# Patient Record
Sex: Female | Born: 1995 | Race: White | Hispanic: No | Marital: Single | Smoking: Never smoker
Health system: Southern US, Community
[De-identification: ages and names within clinical notes are randomized; demographics above are authoritative.]

## PROBLEM LIST (undated history)

## (undated) DIAGNOSIS — E282 Polycystic ovarian syndrome: Secondary | ICD-10-CM

## (undated) HISTORY — PX: TONSILLECTOMY: SUR1361

---

## 2017-08-21 ENCOUNTER — Emergency Department
Admission: EM | Admit: 2017-08-21 | Discharge: 2017-08-21 | Disposition: A | Discharge status: Home / Self Care | Payer: Federal, State, Local not specified - PPO | Attending: Emergency Medicine | Admitting: Emergency Medicine

## 2017-08-21 ENCOUNTER — Emergency Department: Payer: Federal, State, Local not specified - PPO

## 2017-08-21 ENCOUNTER — Encounter: Payer: Self-pay | Admitting: Emergency Medicine

## 2017-08-21 DIAGNOSIS — N200 Calculus of kidney: Secondary | ICD-10-CM

## 2017-08-21 DIAGNOSIS — N10 Acute tubulo-interstitial nephritis: Secondary | ICD-10-CM

## 2017-08-21 DIAGNOSIS — R1030 Lower abdominal pain, unspecified: Secondary | ICD-10-CM | POA: Diagnosis present

## 2017-08-21 HISTORY — DX: Polycystic ovarian syndrome: E28.2

## 2017-08-21 LAB — COMPREHENSIVE METABOLIC PANEL
ALBUMIN: 3.9 g/dL (ref 3.5–5.0)
ALK PHOS: 43 U/L (ref 38–126)
ALT: 36 U/L (ref 14–54)
AST: 34 U/L (ref 15–41)
Anion gap: 9 (ref 5–15)
BILIRUBIN TOTAL: 0.2 mg/dL — AB (ref 0.3–1.2)
BUN: 7 mg/dL (ref 6–20)
CALCIUM: 9.1 mg/dL (ref 8.9–10.3)
CO2: 25 mmol/L (ref 22–32)
CREATININE: 0.54 mg/dL (ref 0.44–1.00)
Chloride: 104 mmol/L (ref 101–111)
GFR calc Af Amer: >60 mL/min (ref >60)
GLUCOSE: 126 mg/dL — AB (ref 65–99)
Potassium: 3.6 mmol/L (ref 3.5–5.1)
Sodium: 138 mmol/L (ref 135–145)
TOTAL PROTEIN: 7.4 g/dL (ref 6.5–8.1)

## 2017-08-21 LAB — URINALYSIS, COMPLETE (UACMP) WITH MICROSCOPIC
Bilirubin Urine: NEGATIVE
GLUCOSE, UA: NEGATIVE mg/dL
Ketones, ur: NEGATIVE mg/dL
LEUKOCYTES UA: NEGATIVE
Nitrite: NEGATIVE
PH: 6 (ref 5.0–8.0)
Protein, ur: NEGATIVE mg/dL
Specific Gravity, Urine: 1.004 — ABNORMAL LOW (ref 1.005–1.030)

## 2017-08-21 LAB — LIPASE, BLOOD: Lipase: 25 U/L (ref 11–51)

## 2017-08-21 LAB — POCT PREGNANCY, URINE: Preg Test, Ur: NEGATIVE

## 2017-08-21 MED ORDER — SODIUM CHLORIDE 0.9 % IV BOLUS (SEPSIS)
500.0000 mL | Freq: Once | INTRAVENOUS | Status: AC
Start: 2017-08-21 — End: 2017-08-21
  Administered 2017-08-21: 500 mL via INTRAVENOUS

## 2017-08-21 MED ORDER — SODIUM CHLORIDE 0.9 % IV SOLN
1.0000 g | Freq: Once | INTRAVENOUS | Status: AC
  Administered 2017-08-21: 1 g via INTRAVENOUS
  Filled 2017-08-21: qty 10

## 2017-08-21 MED ORDER — ACETAMINOPHEN 500 MG PO TABS
1000.0000 mg | ORAL_TABLET | ORAL | Status: AC
  Administered 2017-08-21: 1000 mg via ORAL
  Filled 2017-08-21: qty 2

## 2017-08-21 MED ORDER — CEPHALEXIN 500 MG PO CAPS: 500.0000 mg | ORAL_CAPSULE | Freq: Two times a day (BID) | ORAL | 0 refills | Status: AC

## 2017-08-21 NOTE — ED Triage Notes (Signed)
Patient has been on Cipro for the past 5 days of a 10 day course as a result of a UTI dx.  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  Pt denies any blood in urine.  She is reporting 5/10 flank pain.

## 2017-08-21 NOTE — ED Provider Notes (Signed)
[facility] Emergency Department Provider Note   ____________________________________________   First MD Initiated Contact with Patient 08/21/17 2047     (approximate)  I have reviewed the triage vital signs and the nursing notes.   HISTORY  Chief Complaint Flank Pain    HPI Kristin Campbell is a 22 y.o. 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  Patient reports about 1 week ago she returned home for a funeral.  She began to develop symptoms of back discomfort over the left mid back, also a fever to 103.  Some nausea but no vomiting.  Some discomfort with urination.  She was started on ciprofloxacin for a possible urinary tract infection or "kidney infection".  She is taken 5 days of ciprofloxacin, reports her fevers are improving but still having a fever yesterday to about 101.  She reports that she has had a ongoing moderate discomfort in her left upper back.  Is been persistent for about 1 week now.  She is beginning to wonder if she might have a "kidney stone" as she has a history of elevated calcium over her lifetime.  Denies chest pain.  No trouble breathing.  Slight nasal congestion over the last couple of weeks she had she attributes to a mild "cold".  Took ibuprofen about 2 hours ago that provides modest relief of her pain.  No history of previous kidney stones.  Denies lower abdominal pain, denies pelvic pain.  No vaginal discharge.  Denies pregnancy.    Past Medical History:  Diagnosis Date  . Polycystic ovarian syndrome     There are no active problems to display for this patient.   Past Surgical History:  Procedure Laterality Date  . TONSILLECTOMY      Prior to Admission medications   Medication Sig Start Date End Date Taking? Authorizing Provider  cephALEXin (KEFLEX) 500 MG capsule Take 1 capsule (500 mg total) by mouth 2 (two) times daily.  08/21/17   Sharyn Creamer, MD  Metformin and spironolactone  Allergies Patient has no known allergies.  No family history on file.  Social History Social History   Tobacco Use  . Smoking status: Never Smoker  . Smokeless tobacco: Never Used  Substance Use Topics  . Alcohol use: Yes  . Drug use: No    Review of Systems Constitutional: Fevers, no chills.  Slight fatigue.   Eyes: No visual changes. ENT: No sore throat. Cardiovascular: Denies chest pain. Respiratory: Denies shortness of breath. Gastrointestinal: No abdominal pain but has a pain that seems to be located in her left mid back.  No nausea, no vomiting.  No diarrhea.  No constipation. Genitourinary: See HPI Musculoskeletal: See HPI Skin: Negative for rash. Neurological: Negative for headaches, focal weakness or numbness.    ____________________________________________   PHYSICAL EXAM:  VITAL SIGNS: ED Triage Vitals [08/21/17 1919]  Enc Vitals Group     BP (!) 162/90     Pulse Rate 78     Resp 18     Temp 98.5 F (36.9 C)     Temp Source Oral     SpO2 100 %     Weight 150 lb (68 kg)     Height 5\' 4"  (1.626 m)     Head Circumference      Peak Flow      Pain Score 6     Pain Loc      Pain Edu?  Excl. in GC?     Constitutional: Alert and oriented. Well appearing and in no acute distress.  Patient is very pleasant. Eyes: Conjunctivae are normal. Head: Atraumatic. Nose: No congestion/rhinnorhea. Mouth/Throat: Mucous membranes are moist. Neck: No stridor.   Cardiovascular: Normal rate, regular rhythm. Grossly normal heart sounds.  Good peripheral circulation. Respiratory: Normal respiratory effort.  No retractions. Lungs CTAB. Gastrointestinal: Soft and nontender. No distention.  No rebound or guarding.  Denies pain to palpation anywhere.  No suprapubic tenderness. Musculoskeletal: No lower extremity tenderness nor edema.  No right-sided CVA tenderness.  Slight left CVA tenderness. Neurologic:   Normal speech and language. No gross focal neurologic deficits are appreciated.  Skin:  Skin is warm, dry and intact. No rash noted. Psychiatric: Mood and affect are normal. Speech and behavior are normal.  ____________________________________________   LABS (all labs ordered are listed, but only abnormal results are displayed)  Labs Reviewed  URINALYSIS, COMPLETE (UACMP) WITH MICROSCOPIC - Abnormal; Notable for the following components:      Result Value   Color, Urine STRAW (*)    APPearance CLEAR (*)    Specific Gravity, Urine 1.004 (*)    Hgb urine dipstick MODERATE (*)    Bacteria, UA RARE (*)    Squamous Epithelial / LPF 0-5 (*)    All other components within normal limits  COMPREHENSIVE METABOLIC PANEL - Abnormal; Notable for the following components:   Glucose, Bld 126 (*)    Total Bilirubin 0.2 (*)    All other components within normal limits  URINE CULTURE  LIPASE, BLOOD  POCT PREGNANCY, URINE   ____________________________________________  EKG   ____________________________________________  RADIOLOGY  Tiny nonobstructing left renal calculus.  CT scan reviewed by me. ____________________________________________   PROCEDURES  Procedure(s) performed: None  Procedures  Critical Care performed: No  ____________________________________________   INITIAL IMPRESSION / ASSESSMENT AND PLAN / ED COURSE  Pertinent labs & imaging results that were available during my care of the patient were reviewed by me and considered in my medical decision making (see chart for details).  Patient presents for evaluation of fever, left mid back pain in the lower thoracic/costovertebral region.  She reports recent treatment with Cipro for a fever, and the.  Her fevers seem to be improving and her dysuria has improved somewhat but she continues to have discomfort in the left flank.  She is concerned about the possibility of kidney stone causing this persistent pain.  She appears  well appearing, nontoxic and in no distress.  Denies any vomiting.  Very reassuring abdominal exam, but given her history and review of her urinalysis that does demonstrate some bacteria despite a clean sample I think the possibility of a slightly resistant infection is raised.  I suspect likely based on my clinical history she has had pyelonephritis, likely improving but still some ongoing low-grade fevers.  Discussed risks and benefits of CT versus ultrasound, and given her ongoing bacteria in the urine and the persistence of her left sided pain we will obtain a CT scan to further evaluate.  No signs or symptoms suggest gynecologic etiology, no lower pelvic pain, felt clinically very low risk for torsion especially after 1 week of symptoms and lack of gynecologic symptoms.  ----------------------------------------- 9:52 PM on 08/21/2017 -----------------------------------------  Patient resting comfortably working on her laptop.  She appears comfortable reports that she feels well.  Received a dose of Rocephin, suspect a partially treated pyelonephritis which is now improving but has not completely resolved given the bacteria  still in her urine.  No evidence of a kidney stone actively causing any obstruction, and possibly she could have a minuscule stone that is caused her pain and discomfort which is moved down the ureter.  Discussed with the patient very careful return precautions, will place her on cephalexin and she will stop Cipro.  We will culture her urine.  [facility]  Return precautions and treatment recommendations and follow-up discussed with the patient who is agreeable with the plan.       ____________________________________________   FINAL CLINICAL IMPRESSION(S) / ED DIAGNOSES  Final diagnoses:  Renal calculus, left  Acute pyelonephritis      NEW MEDICATIONS STARTED DURING THIS VISIT:  New Prescriptions   CEPHALEXIN (KEFLEX)  500 MG CAPSULE    Take 1 capsule (500 mg total) by mouth 2 (two) times daily.     Note:  This document was prepared using Dragon voice recognition software and may include unintentional dictation errors.     Sharyn CreamerQuale, [place] Gustafson, MD 08/21/17 2153

## 2017-08-21 NOTE — Discharge Instructions (Signed)
You have been seen in the Emergency Department (ED) today for pain along the left back and fever with a recent urinary tract infection.  I suspect that your urinary tract infection was in your left kidney, condition called pyelonephritis, and that the antibiotic you were on had treated the infection partially but some still remains.  Please take your antibiotic as prescribed and over-the-counter pain medication (Tylenol or Motrin) as needed, but no more than recommended on the label instructions.  Drink PLENTY of fluids.  [facility] to schedule the next available appointment to follow up on today?s ED visit, or return immediately to the ED if your pain worsens, you have decreased urine production, develop fever, persistent vomiting, or other symptoms that concern you.

## 2017-08-21 NOTE — ED Notes (Signed)
Patient transported to CT 

## 2017-08-23 LAB — URINE CULTURE
CULTURE: NO GROWTH
SPECIAL REQUESTS: NORMAL

## 2018-08-24 ENCOUNTER — Other Ambulatory Visit: Payer: Self-pay

## 2018-08-24 ENCOUNTER — Encounter: Payer: Self-pay | Admitting: Emergency Medicine

## 2018-08-24 DIAGNOSIS — R509 Fever, unspecified: Secondary | ICD-10-CM | POA: Diagnosis present

## 2018-08-24 DIAGNOSIS — B349 Viral infection, unspecified: Secondary | ICD-10-CM | POA: Insufficient documentation

## 2018-08-24 DIAGNOSIS — R51 Headache: Secondary | ICD-10-CM | POA: Insufficient documentation

## 2018-08-24 LAB — CBC WITH DIFFERENTIAL/PLATELET
Abs Immature Granulocytes: 0.01 10*3/uL (ref 0.00–0.07)
BASOS ABS: 0.1 10*3/uL (ref 0.0–0.1)
BASOS PCT: 1 %
EOS ABS: 0.1 10*3/uL (ref 0.0–0.5)
EOS PCT: 1 %
HEMATOCRIT: 42.6 % (ref 36.0–46.0)
Hemoglobin: 13.8 g/dL (ref 12.0–15.0)
Immature Granulocytes: 0 %
LYMPHS ABS: 4.1 10*3/uL — AB (ref 0.7–4.0)
Lymphocytes Relative: 43 %
MCH: 28.5 pg (ref 26.0–34.0)
MCHC: 32.4 g/dL (ref 30.0–36.0)
MCV: 88 fL (ref 80.0–100.0)
Monocytes Absolute: 1 10*3/uL (ref 0.1–1.0)
Monocytes Relative: 11 %
NRBC: 0 % (ref 0.0–0.2)
Neutro Abs: 4.1 10*3/uL (ref 1.7–7.7)
Neutrophils Relative %: 44 %
PLATELETS: 324 10*3/uL (ref 150–400)
RBC: 4.84 MIL/uL (ref 3.87–5.11)
RDW: 12.4 % (ref 11.5–15.5)
WBC: 9.4 10*3/uL (ref 4.0–10.5)

## 2018-08-24 LAB — INFLUENZA PANEL BY PCR (TYPE A & B)
INFLAPCR: NEGATIVE
INFLBPCR: NEGATIVE

## 2018-08-24 LAB — URINALYSIS, COMPLETE (UACMP) WITH MICROSCOPIC
BILIRUBIN URINE: NEGATIVE
Bacteria, UA: NONE SEEN
GLUCOSE, UA: NEGATIVE mg/dL
Hgb urine dipstick: NEGATIVE
KETONES UR: NEGATIVE mg/dL
LEUKOCYTE UA: NEGATIVE
Nitrite: NEGATIVE
PH: 6 (ref 5.0–8.0)
Protein, ur: NEGATIVE mg/dL
Specific Gravity, Urine: 1.014 (ref 1.005–1.030)

## 2018-08-24 LAB — COMPREHENSIVE METABOLIC PANEL
ALBUMIN: 4.1 g/dL (ref 3.5–5.0)
ALT: 21 U/L (ref 0–44)
ANION GAP: 7 (ref 5–15)
AST: 23 U/L (ref 15–41)
Alkaline Phosphatase: 49 U/L (ref 38–126)
BILIRUBIN TOTAL: 0.3 mg/dL (ref 0.3–1.2)
BUN: 9 mg/dL (ref 6–20)
CO2: 26 mmol/L (ref 22–32)
Calcium: 8.8 mg/dL — ABNORMAL LOW (ref 8.9–10.3)
Chloride: 104 mmol/L (ref 98–111)
Creatinine, Ser: 0.61 mg/dL (ref 0.44–1.00)
GFR calc Af Amer: >60 mL/min (ref >60)
GLUCOSE: 98 mg/dL (ref 70–99)
POTASSIUM: 3.4 mmol/L — AB (ref 3.5–5.1)
Sodium: 137 mmol/L (ref 135–145)
TOTAL PROTEIN: 7.3 g/dL (ref 6.5–8.1)

## 2018-08-24 LAB — POCT PREGNANCY, URINE: Preg Test, Ur: NEGATIVE

## 2018-08-24 LAB — MONONUCLEOSIS SCREEN: Mono Screen: NEGATIVE

## 2018-08-24 NOTE — ED Notes (Addendum)
Lab called regarding pending mono drawn at 930; st will run test now

## 2018-08-24 NOTE — ED Triage Notes (Addendum)
Patient ambulatory to triage with steady gait, without difficulty or distress noted, mask in place; pt reports fever, HA.,neck pain for last few days; seen at urgent care with neg flu test on Tuesday and then again at [facility] but awaiting lab results; pt reports she was rx antibiotics for unknown reason so didn't take them; st she is concerned that she has meningitis; denies any accomp symptoms, no cough/congestion/sore throat; advil taken PTA

## 2018-08-25 ENCOUNTER — Emergency Department: Payer: Federal, State, Local not specified - PPO

## 2018-08-25 ENCOUNTER — Emergency Department
Admission: EM | Admit: 2018-08-25 | Discharge: 2018-08-25 | Disposition: A | Discharge status: Home / Self Care | Payer: Federal, State, Local not specified - PPO | Attending: Emergency Medicine | Admitting: Emergency Medicine

## 2018-08-25 DIAGNOSIS — R509 Fever, unspecified: Secondary | ICD-10-CM

## 2018-08-25 DIAGNOSIS — B349 Viral infection, unspecified: Secondary | ICD-10-CM

## 2018-08-25 LAB — GROUP A STREP BY PCR: Group A Strep by PCR: NOT DETECTED

## 2018-08-25 MED ORDER — ACETAMINOPHEN 325 MG PO TABS
650.0000 mg | ORAL_TABLET | Freq: Once | ORAL | Status: AC
  Administered 2018-08-25: 650 mg via ORAL
  Filled 2018-08-25: qty 2

## 2018-08-25 MED ORDER — SODIUM CHLORIDE 0.9 % IV BOLUS
1000.0000 mL | Freq: Once | INTRAVENOUS | Status: AC
  Administered 2018-08-25: 1000 mL via INTRAVENOUS

## 2018-08-25 NOTE — ED Notes (Signed)
Patient transported to X-ray 

## 2018-08-25 NOTE — ED Notes (Signed)
Patient transported to CT 

## 2018-08-25 NOTE — ED Notes (Signed)
Pt verbalized understanding of d/c instructions, and f/u care. No further questions at this time. Pt ambulatory to the exit with steady gait.  

## 2018-08-25 NOTE — Discharge Instructions (Addendum)
1.  Alternate Tylenol and ibuprofen every 4 hours as needed for fever greater than 100.4 F. 2.  Drink plenty of fluids daily. 3.  Return to the ER for worsening symptoms, persistent vomiting, lethargy or other concerns.

## 2018-08-25 NOTE — ED Provider Notes (Signed)
[facility] Emergency Department Provider Note   ____________________________________________   First MD Initiated Contact with Patient 08/25/18 0114     (approximate)  I have reviewed the triage vital signs and the nursing notes.   HISTORY  Chief Complaint Fever and Headache    HPI Kristin Campbell is a 23 y.o. female who presents [facility] the ED [facility] college [facility] with a chief complaint of fever, headache and neck pain.  Patient reports onset of fever 5 days ago.  Symptoms associated with frontal headache and neck pain onset yesterday.  Patient was seen at urgent care with negative influenza test 2 days ago.  She was then seen at student health and had labs drawn but they will not be back for another few days.  She was prescribed antibiotics but did not take them because there was no clear indication why she required them.  Presents to the ED because she is concerned that she has meningitis.  Has been exposed to other students with strep throat and mono.  Last took ibuprofen at 8 PM for fever ranging from 101 F to 103 F.  Denies associated chest pain, cough, shortness of breath, abdominal pain, nausea, vomiting, dysuria, diarrhea, rash.  Denies recent travel or trauma.    Past Medical History:  Diagnosis Date  . Polycystic ovarian syndrome     There are no active problems to display for this patient.   Past Surgical History:  Procedure Laterality Date  . TONSILLECTOMY      Prior to Admission medications   Medication Sig Start Date End Date Taking? Authorizing Provider  cephALEXin (KEFLEX) 500 MG capsule Take 1 capsule (500 mg total) by mouth 2 (two) times daily. 08/21/17   Sharyn Creamer, MD    Allergies Patient has no known allergies.  No family history on file.  Social History Social History   Tobacco Use  . Smoking status: Never Smoker  . Smokeless tobacco: Never Used  Substance Use Topics  . Alcohol use: Yes  . Drug use: No    Review of  Systems  Constitutional: Positive for fever. Eyes: No visual changes. ENT: No sore throat. Cardiovascular: Denies chest pain. Respiratory: Denies shortness of breath. Gastrointestinal: No abdominal pain.  No nausea, no vomiting.  No diarrhea.  No constipation. Genitourinary: Negative for dysuria. Musculoskeletal: Positive for neck pain.  Negative for back pain. Skin: Negative for rash. Neurological: Positive for headaches. Negative for focal weakness or numbness.  ____________________________________________   PHYSICAL EXAM:  VITAL SIGNS: ED Triage Vitals  Enc Vitals Group     BP 08/24/18 2119 (!) 155/85     Pulse Rate 08/24/18 2119 76     Resp 08/24/18 2119 16     Temp 08/24/18 2119 98.4 F (36.9 C)     Temp Source 08/24/18 2119 Oral     SpO2 08/24/18 2119 100 %     Weight 08/24/18 2127 155 lb (70.3 kg)     Height 08/24/18 2127 5\' 4"  (1.626 m)     Head Circumference --      Peak Flow --      Pain Score 08/24/18 2127 6     Pain Loc --      Pain Edu? --      Excl. in GC? --     Constitutional: Alert and oriented. Well appearing and in mild acute distress. Eyes: Conjunctivae are normal. PERRL. EOMI. Head: Atraumatic. Nose: No congestion/rhinnorhea. Mouth/Throat: Mucous membranes are moist.  Oropharynx mildly erythematous without  tonsillar exudate, swelling or peritonsillar abscess.  There is.  There is no drooling. Neck: No stridor.  No cervical spine tenderness to palpation.  Supple neck without meningismus.   Hematological/Lymphatic/Immunilogical: No cervical lymphadenopathy. Cardiovascular: Normal rate, regular rhythm. Grossly normal heart sounds.  Good peripheral circulation. Respiratory: Normal respiratory effort.  No retractions. Lungs CTAB. Gastrointestinal: Soft and nontender. No distention. No abdominal bruits. No CVA tenderness. Musculoskeletal: No lower extremity tenderness nor edema.  No joint effusions. Neurologic:  Normal speech and language. No gross  focal neurologic deficits are appreciated. No gait instability. Skin:  Skin is warm, dry and intact. No rash noted.  No petechiae. Psychiatric: Mood and affect are normal. Speech and behavior are normal.  ____________________________________________   LABS (all labs ordered are listed, but only abnormal results are displayed)  Labs Reviewed  CBC WITH DIFFERENTIAL/PLATELET - Abnormal; Notable for the following components:      Result Value   Lymphs Abs 4.1 (*)    All other components within normal limits  COMPREHENSIVE METABOLIC PANEL - Abnormal; Notable for the following components:   Potassium 3.4 (*)    Calcium 8.8 (*)    All other components within normal limits  URINALYSIS, COMPLETE (UACMP) WITH MICROSCOPIC - Abnormal; Notable for the following components:   Color, Urine YELLOW (*)    APPearance CLEAR (*)    All other components within normal limits  GROUP A STREP BY PCR  CULTURE, BLOOD (ROUTINE X 2)  CULTURE, BLOOD (ROUTINE X 2)  URINE CULTURE  INFLUENZA PANEL BY PCR (TYPE A & B)  MONONUCLEOSIS SCREEN  POCT PREGNANCY, URINE   ____________________________________________  EKG  None ____________________________________________  RADIOLOGY  ED MD interpretation:  No ICH, no acute cardiopulmonary process  Official radiology report(s): Dg Chest 2 View  Result Date: 08/25/2018 CLINICAL DATA:  Fever, headache, and neck pain for a few days. EXAM: CHEST - 2 VIEW COMPARISON:  None. FINDINGS: Normal heart size and pulmonary vascularity. No focal airspace disease or consolidation in the lungs. No blunting of costophrenic angles. No pneumothorax. Mediastinal contours appear intact. IMPRESSION: No active cardiopulmonary disease. Electronically Signed   By: Burman Nieves M.D.   On: 08/25/2018 01:52   Ct Head Wo Contrast  Result Date: 08/25/2018 CLINICAL DATA:  23 year old female with fever and headache. EXAM: CT HEAD WITHOUT CONTRAST TECHNIQUE: Contiguous axial images were  obtained from the base of the skull through the vertex without intravenous contrast. COMPARISON:  None. FINDINGS: Brain: The ventricles and sulci appropriate size for patient's age. The gray-white matter discrimination is preserved. There is no acute intracranial hemorrhage. No mass effect or midline shift. No extra-axial fluid collection. Vascular: No hyperdense vessel or unexpected calcification. Skull: No acute calvarial pathology. Dolichocephaly. Sinuses/Orbits: No acute finding. Other: None IMPRESSION: No acute intracranial pathology. Electronically Signed   By: Elgie Collard M.D.   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  Differential diagnosis includes but is not limited to viral syndrome, influenza, strep throat, UTI, mononucleosis, meningitis, etc.  Laboratory and urinalysis  results are unremarkable.  Patient is well-appearing on exam with supple neck.  We did discuss concern for possible viral meningitis and how to test for it via LP.  We discussed risk/benefits of obtaining LP and disposition of hospitalization versus discharge home depending on results and how patient feels.  She will call her parents and we will all discuss how to proceed.    Clinical Course as of Aug 25 656  Fri Aug 25, 2018  0139 Spoke with patient as well as her parents via speaker phone.  Discussed risk/benefits of LP as well as disposition if patient  did have viral meningitis.  Have low suspicion for bacterial meningitis as patient has a supple neck, no elevation of WBC, and is overall well-appearing.  For now, it is reasonable to proceed with CT head, chest x-ray, strep and blood cultures.  Patient and her parents wish to hold off on LP for now.  We will revisit it once the rest of the work-up is back.   [JS]  [id number] Patient feeling much better after IV fluids.  Updated her of negative strep, CT head and chest x-ray results.  She is laughing and visiting with a friend who is at bedside.  She has decided not to proceed with LP.  I gave her strict return precautions.  Instructed her if she felt worse to proceed directly to the ER instead of urgent care or student health. Patient verbalizes understanding and agrees with plan of care.   [JS]    Clinical Course User Index [JS] Irean Hong, MD     ____________________________________________   FINAL CLINICAL IMPRESSION(S) / ED DIAGNOSES  Final diagnoses:  Fever, unspecified fever cause  Viral syndrome     ED Discharge Orders    None       Note:  This document was prepared using Dragon voice recognition software and may include unintentional dictation errors.   Irean Hong, MD 08/25/18 0700

## 2018-08-26 LAB — URINE CULTURE: CULTURE: NO GROWTH

## 2018-08-30 LAB — CULTURE, BLOOD (ROUTINE X 2)
CULTURE: NO GROWTH
Culture: NO GROWTH
Special Requests: ADEQUATE

## 2020-06-21 IMAGING — CT CT HEAD W/O CM
3 of 4 series · 14 of 47 positions shown, 16 images · non-contrast
Comparison: None.

CLINICAL DATA: 23-year-old female with fever and headache.

EXAM:
CT HEAD WITHOUT CONTRAST
TECHNIQUE: Contiguous axial images were obtained from the base of the skull
through the vertex without intravenous contrast.

[Series 4: coronal soft tissue · coronal · 0.29mm/px · 3 of 65 slices shown]
[im 22/65  brain]
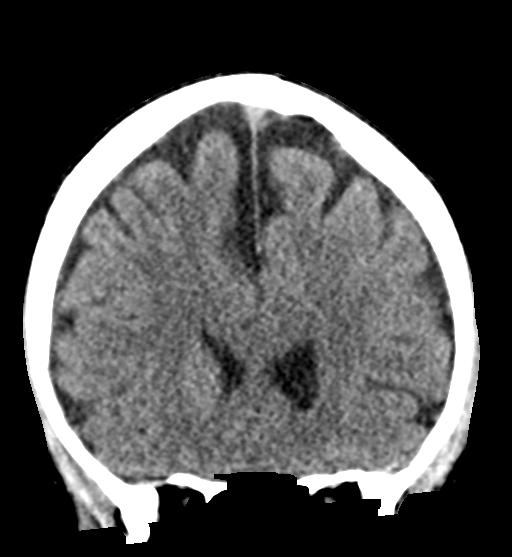
[im 29/65  brain]
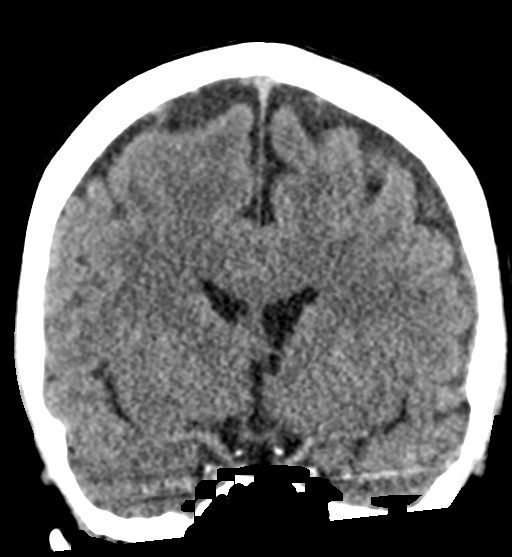
[im 36/65  brain]
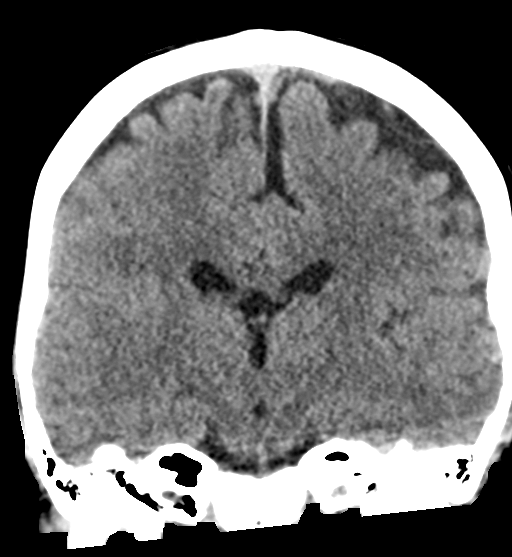

[Series 5: sagittal soft tissue · sagittal · 0.34mm/px · 3 of 48 slices shown]
[im 16/48  brain]
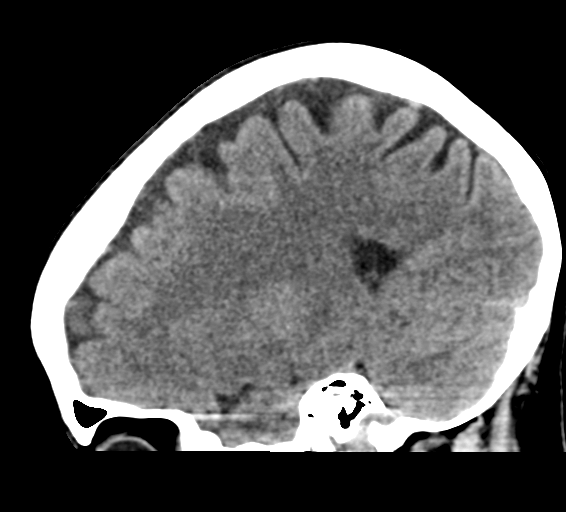
[im 24/48  brain]
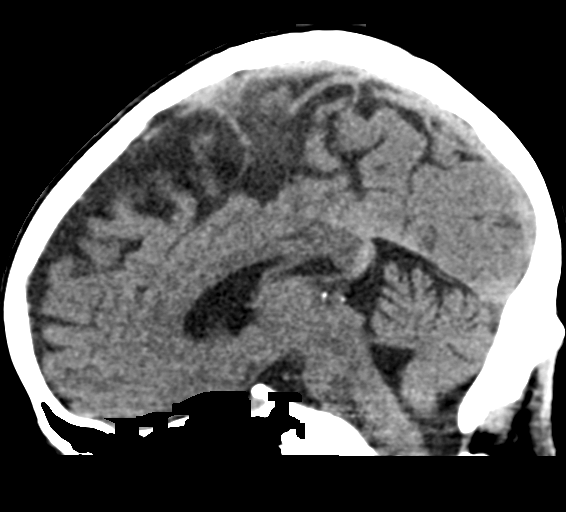
[im 32/48  brain]
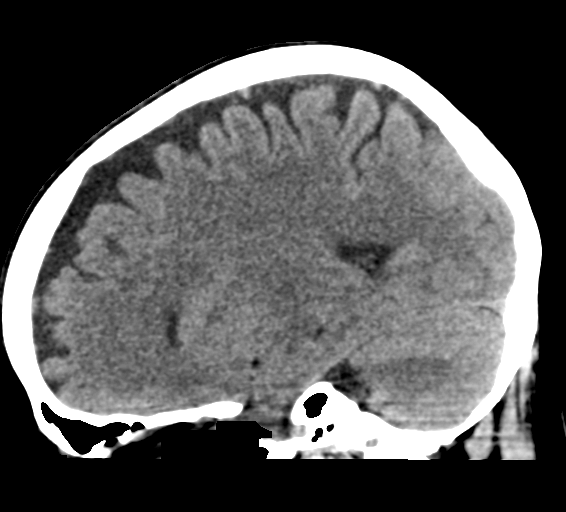

[Series 6: ax head wo recons · axial · 0.31mm/px · z∈[-156,-26]mm · 8 of 31 slices shown, 10 images]
[im 3/31  brain]
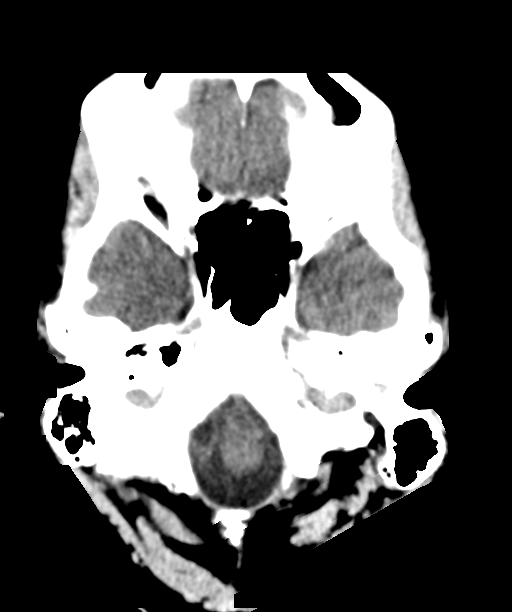
[im 3/31  bone]
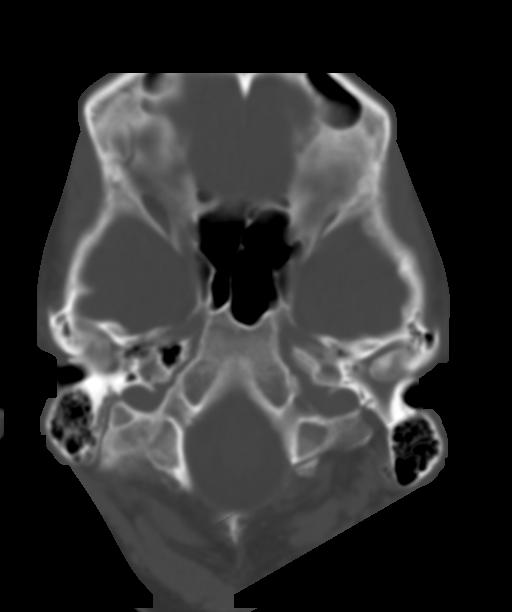
[im 7/31  brain]
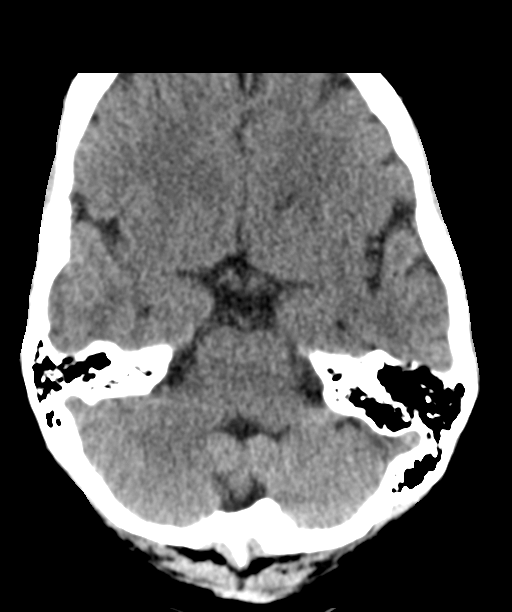
[im 11/31  brain]
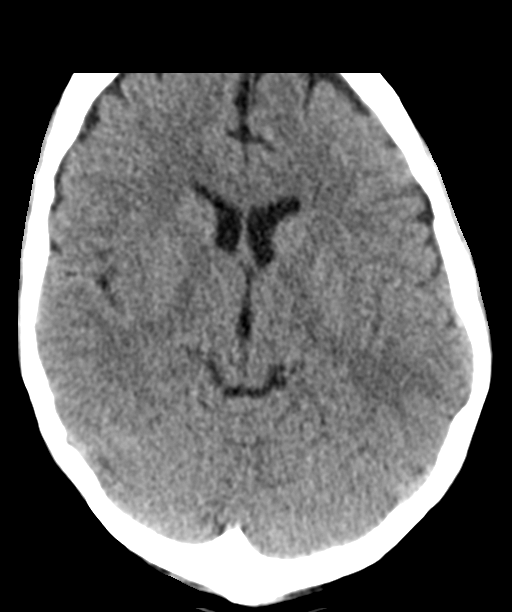
[im 15/31  brain]
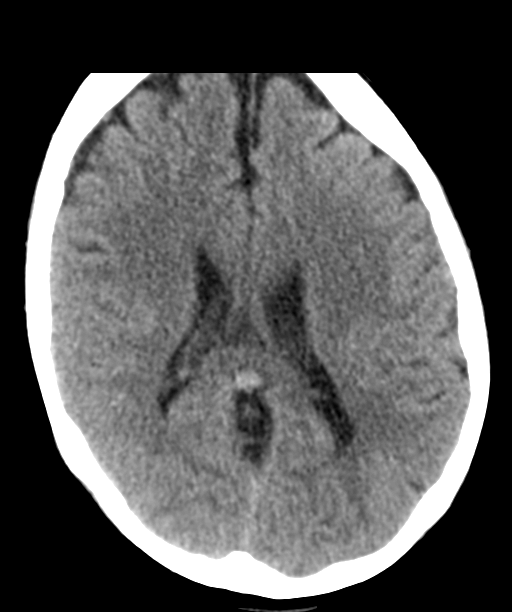
[im 17/31  brain]
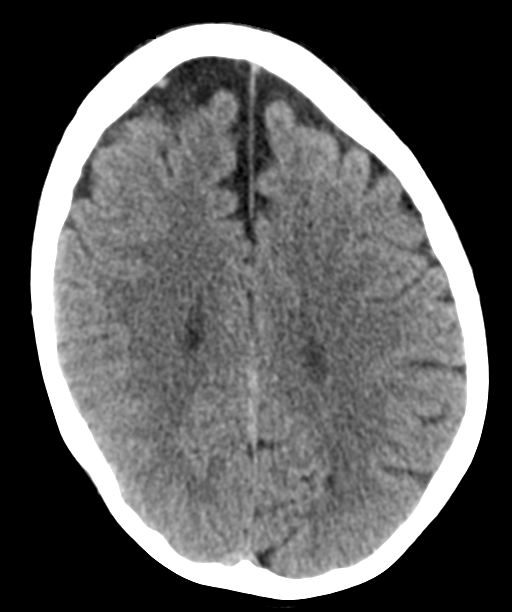
[im 17/31  bone]
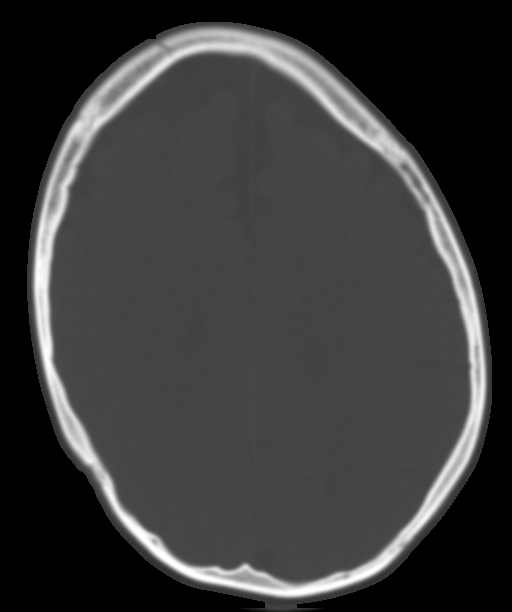
[im 21/31  brain]
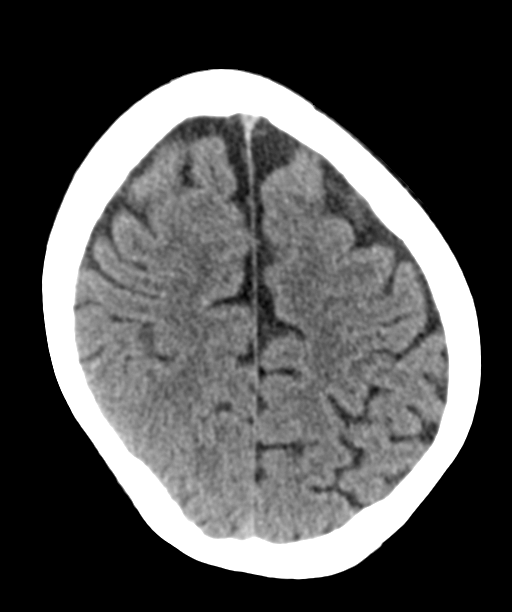
[im 25/31  brain]
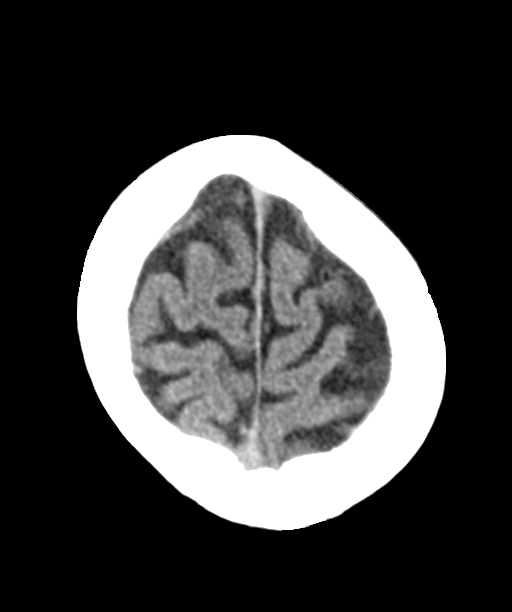
[im 29/31  brain]
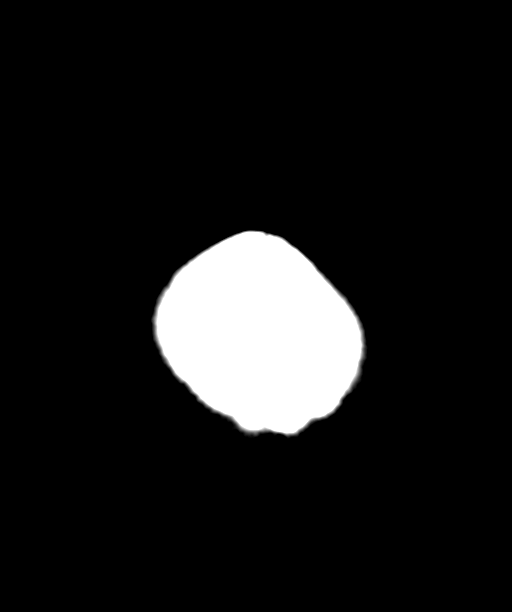

[14 of 47 positions shown; findings below may reference images not displayed]

FINDINGS: Brain: The ventricles and sulci appropriate size for patient's age.
The gray-white matter discrimination is preserved. There is no acute
intracranial hemorrhage. No mass effect or midline shift. No
extra-axial fluid collection.

Vascular: No hyperdense vessel or unexpected calcification.

Skull: No acute calvarial pathology. Dolichocephaly.

Sinuses/Orbits: No acute finding.

Other: None
IMPRESSION: No acute intracranial pathology.

## 2020-06-21 IMAGING — CR DG CHEST 2V
3 series · 3 of 3 positions shown · non-contrast
Comparison: None.

CLINICAL DATA: Fever, headache, and neck pain for a few days.

EXAM:
CHEST - 2 VIEW

[chest pa (1 of 2)]
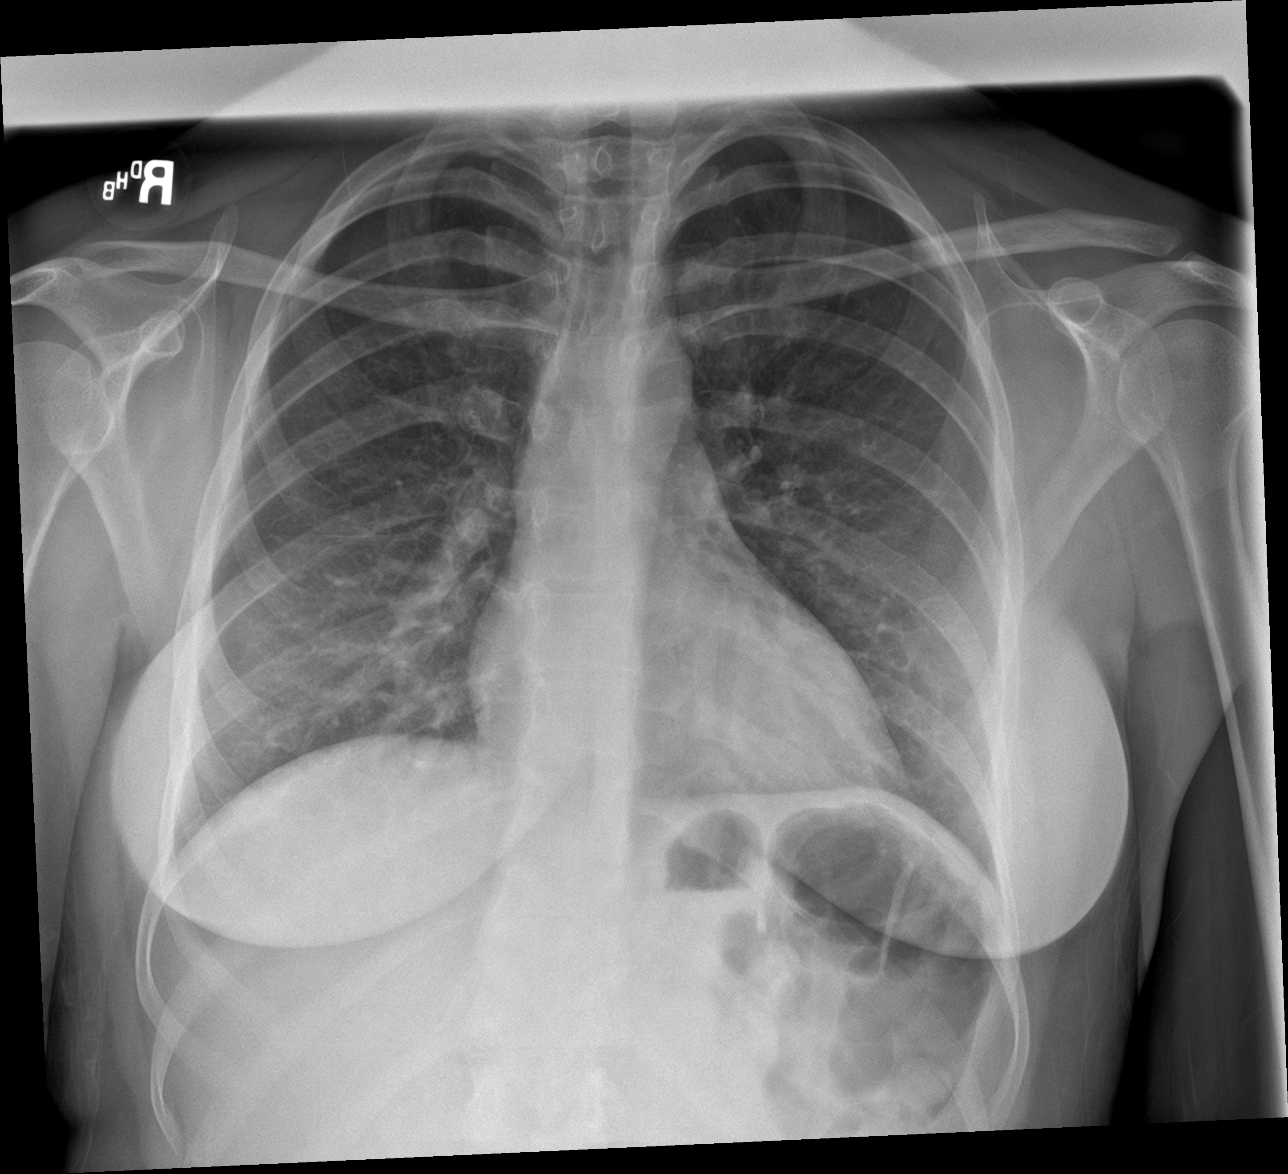

[chest lat]
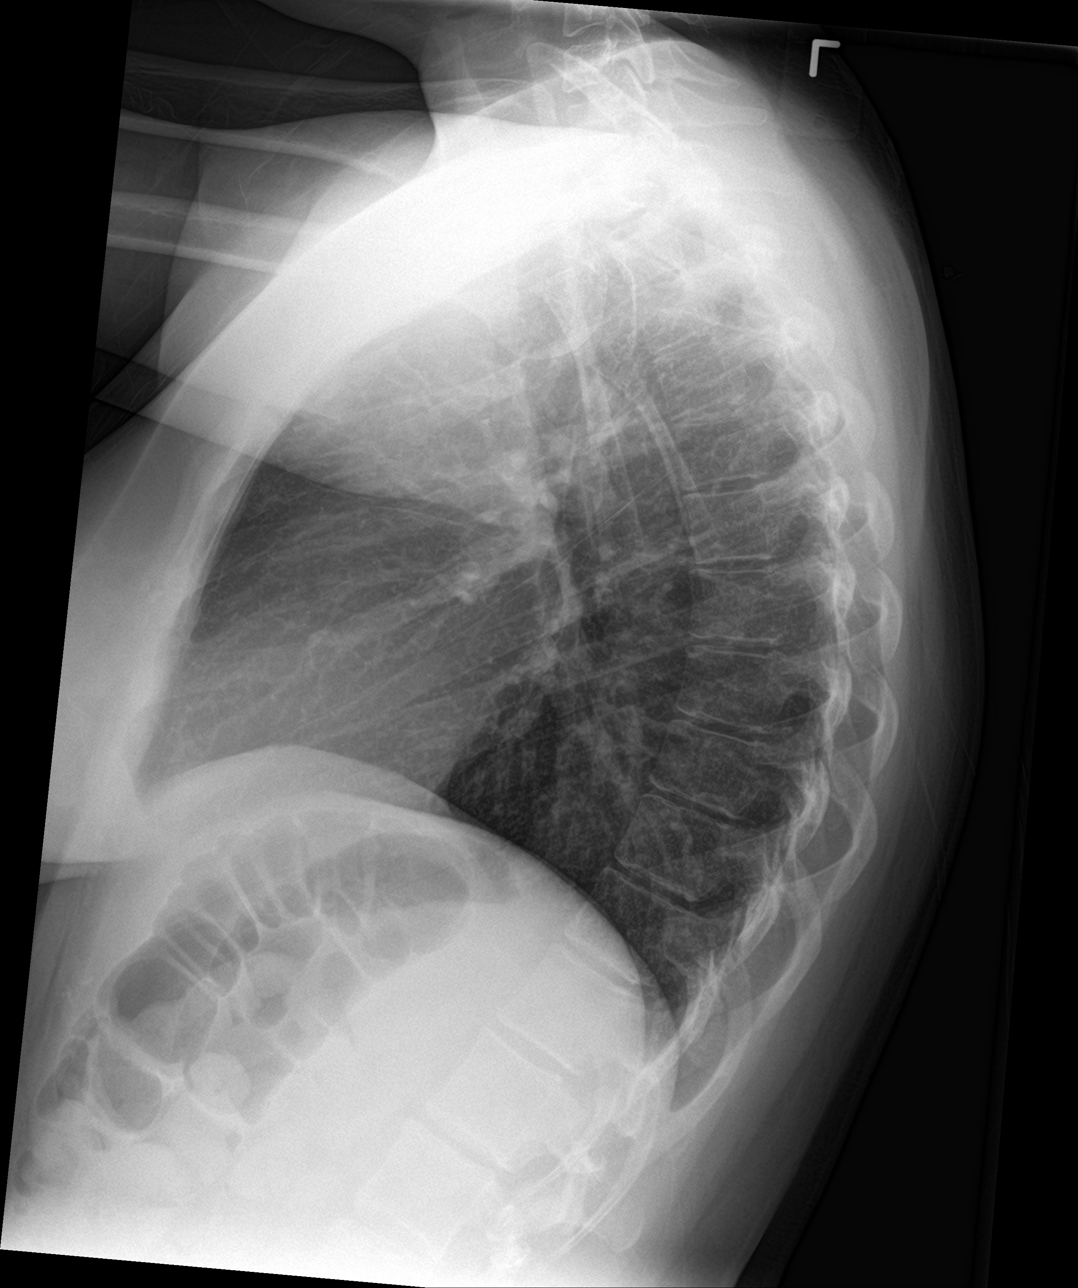

[chest pa (2 of 2)]
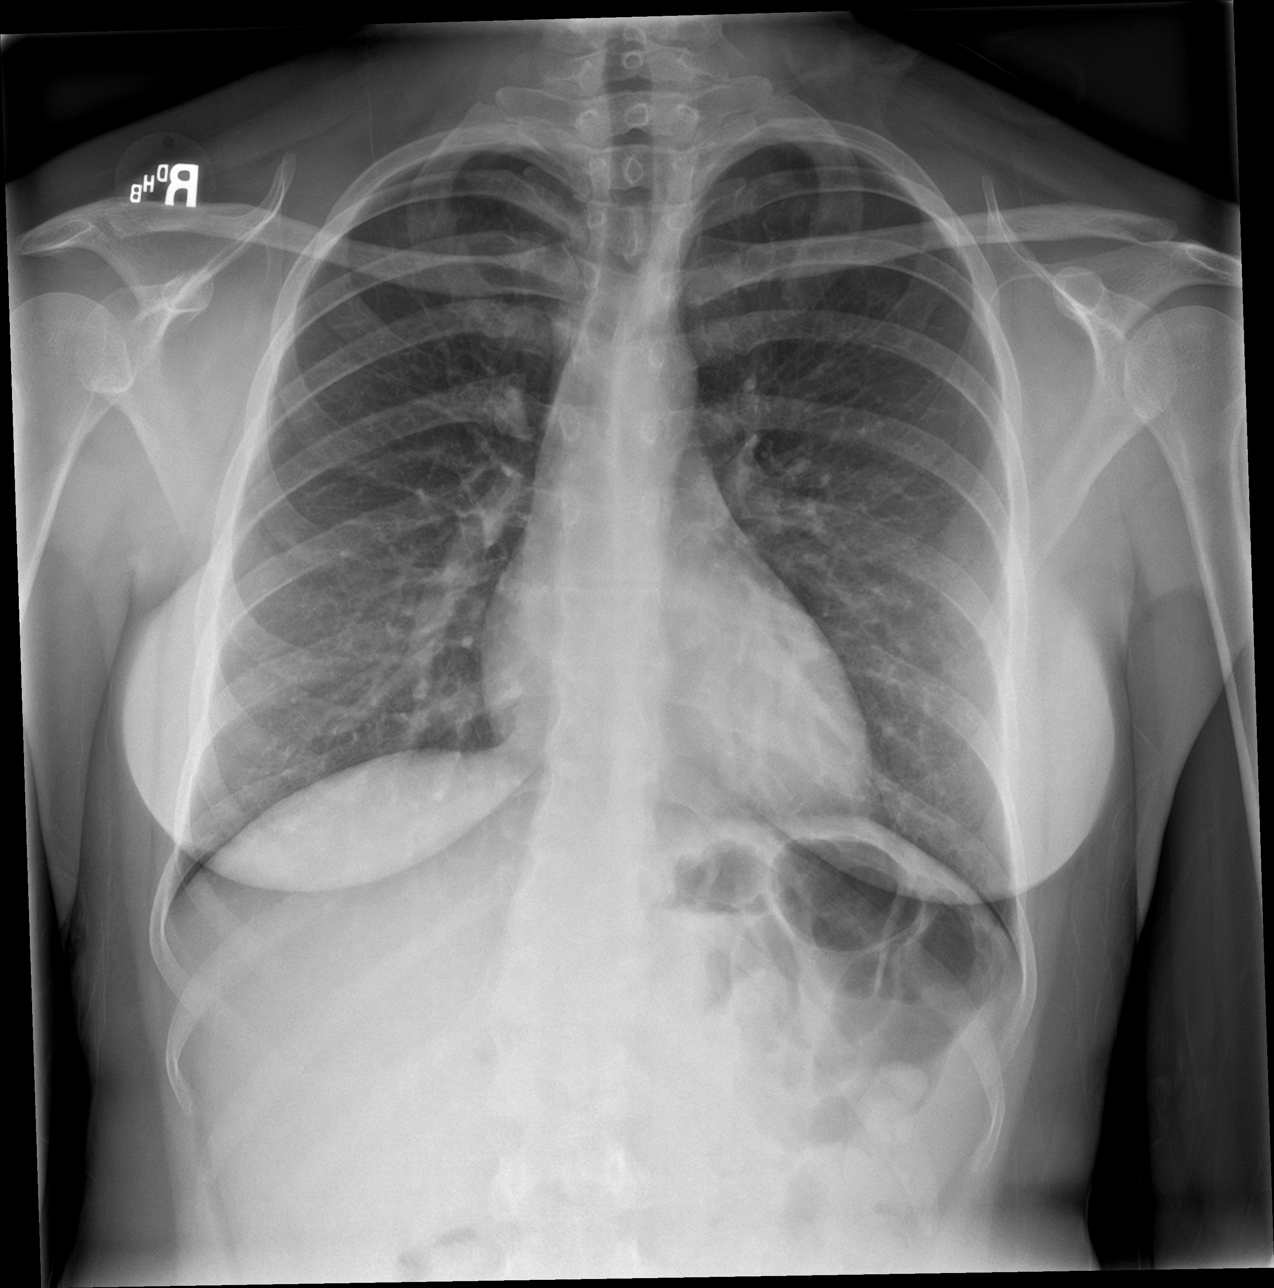

[3 of 3 positions shown; findings below may reference images not displayed]

FINDINGS: Normal heart size and pulmonary vascularity. No focal airspace
disease or consolidation in the lungs. No blunting of costophrenic
angles. No pneumothorax. Mediastinal contours appear intact.
IMPRESSION: No active cardiopulmonary disease.
